# Patient Record
Sex: Male | Born: 1970 | Race: Black or African American | Hispanic: No | Marital: Single | Smoking: Current every day smoker
Health system: Southern US, Community
[De-identification: ages and names within clinical notes are randomized; demographics above are authoritative.]

## PROBLEM LIST (undated history)

## (undated) DIAGNOSIS — E669 Obesity, unspecified: Secondary | ICD-10-CM

## (undated) DIAGNOSIS — E119 Type 2 diabetes mellitus without complications: Secondary | ICD-10-CM

## (undated) DIAGNOSIS — N289 Disorder of kidney and ureter, unspecified: Secondary | ICD-10-CM

## (undated) DIAGNOSIS — I10 Essential (primary) hypertension: Secondary | ICD-10-CM

## (undated) DIAGNOSIS — C189 Malignant neoplasm of colon, unspecified: Secondary | ICD-10-CM

## (undated) DIAGNOSIS — C801 Malignant (primary) neoplasm, unspecified: Secondary | ICD-10-CM

---

## 2013-10-03 ENCOUNTER — Encounter (HOSPITAL_COMMUNITY): Payer: Self-pay | Admitting: Emergency Medicine

## 2013-10-03 ENCOUNTER — Emergency Department (HOSPITAL_COMMUNITY): Payer: No Typology Code available for payment source

## 2013-10-03 ENCOUNTER — Emergency Department (HOSPITAL_COMMUNITY)
Admission: EM | Admit: 2013-10-03 | Discharge: 2013-10-03 | Disposition: A | Discharge status: Home / Self Care | Payer: No Typology Code available for payment source | Attending: Emergency Medicine | Admitting: Emergency Medicine

## 2013-10-03 DIAGNOSIS — R079 Chest pain, unspecified: Secondary | ICD-10-CM | POA: Insufficient documentation

## 2013-10-03 DIAGNOSIS — I10 Essential (primary) hypertension: Secondary | ICD-10-CM | POA: Insufficient documentation

## 2013-10-03 DIAGNOSIS — Z862 Personal history of diseases of the blood and blood-forming organs and certain disorders involving the immune mechanism: Secondary | ICD-10-CM | POA: Insufficient documentation

## 2013-10-03 DIAGNOSIS — R05 Cough: Secondary | ICD-10-CM

## 2013-10-03 DIAGNOSIS — M549 Dorsalgia, unspecified: Secondary | ICD-10-CM | POA: Insufficient documentation

## 2013-10-03 DIAGNOSIS — F17200 Nicotine dependence, unspecified, uncomplicated: Secondary | ICD-10-CM | POA: Insufficient documentation

## 2013-10-03 DIAGNOSIS — E669 Obesity, unspecified: Secondary | ICD-10-CM | POA: Insufficient documentation

## 2013-10-03 DIAGNOSIS — R109 Unspecified abdominal pain: Secondary | ICD-10-CM | POA: Insufficient documentation

## 2013-10-03 DIAGNOSIS — E119 Type 2 diabetes mellitus without complications: Secondary | ICD-10-CM | POA: Insufficient documentation

## 2013-10-03 DIAGNOSIS — Z87448 Personal history of other diseases of urinary system: Secondary | ICD-10-CM | POA: Insufficient documentation

## 2013-10-03 DIAGNOSIS — R739 Hyperglycemia, unspecified: Secondary | ICD-10-CM

## 2013-10-03 DIAGNOSIS — R059 Cough, unspecified: Secondary | ICD-10-CM | POA: Insufficient documentation

## 2013-10-03 HISTORY — DX: Disorder of kidney and ureter, unspecified: N28.9

## 2013-10-03 HISTORY — DX: Malignant neoplasm of colon, unspecified: C18.9

## 2013-10-03 HISTORY — DX: Type 2 diabetes mellitus without complications: E11.9

## 2013-10-03 HISTORY — DX: Obesity, unspecified: E66.9

## 2013-10-03 HISTORY — DX: Essential (primary) hypertension: I10

## 2013-10-03 HISTORY — DX: Malignant (primary) neoplasm, unspecified: C80.1

## 2013-10-03 LAB — CBC
HEMATOCRIT: 42 % (ref 39.0–52.0)
Hemoglobin: 14.4 g/dL (ref 13.0–17.0)
MCH: 30 pg (ref 26.0–34.0)
MCHC: 34.3 g/dL (ref 30.0–36.0)
MCV: 87.5 fL (ref 78.0–100.0)
PLATELETS: 195 10*3/uL (ref 150–400)
RBC: 4.8 MIL/uL (ref 4.22–5.81)
RDW: 13.8 % (ref 11.5–15.5)
WBC: 10.2 10*3/uL (ref 4.0–10.5)

## 2013-10-03 LAB — URINALYSIS, ROUTINE W REFLEX MICROSCOPIC
Bilirubin Urine: NEGATIVE
Hgb urine dipstick: NEGATIVE
KETONES UR: NEGATIVE mg/dL
Leukocytes, UA: NEGATIVE
Nitrite: NEGATIVE
PROTEIN: 30 mg/dL — AB
Specific Gravity, Urine: 1.031 — ABNORMAL HIGH (ref 1.005–1.030)
UROBILINOGEN UA: 0.2 mg/dL (ref 0.0–1.0)
pH: 5 (ref 5.0–8.0)

## 2013-10-03 LAB — URINE MICROSCOPIC-ADD ON

## 2013-10-03 LAB — BASIC METABOLIC PANEL
BUN: 10 mg/dL (ref 6–23)
CHLORIDE: 100 meq/L (ref 96–112)
CO2: 22 mEq/L (ref 19–32)
Calcium: 8.9 mg/dL (ref 8.4–10.5)
Creatinine, Ser: 0.59 mg/dL (ref 0.50–1.35)
Glucose, Bld: 238 mg/dL — ABNORMAL HIGH (ref 70–99)
Potassium: 4.9 mEq/L (ref 3.7–5.3)
Sodium: 135 mEq/L — ABNORMAL LOW (ref 137–147)

## 2013-10-03 LAB — I-STAT TROPONIN, ED: TROPONIN I, POC: 0 ng/mL (ref 0.00–0.08)

## 2013-10-03 LAB — TROPONIN I: Troponin I: <0.3 ng/mL (ref <0.30)

## 2013-10-03 MED ORDER — TRAMADOL HCL 50 MG PO TABS: 50.0000 mg | ORAL_TABLET | Freq: Four times a day (QID) | ORAL | Status: DC | PRN

## 2013-10-03 MED ORDER — SODIUM CHLORIDE 0.9 % IV BOLUS (SEPSIS)
500.0000 mL | Freq: Once | INTRAVENOUS | Status: AC
  Administered 2013-10-03: 500 mL via INTRAVENOUS

## 2013-10-03 MED ORDER — HYDROCODONE-ACETAMINOPHEN 5-325 MG PO TABS: 2.0000 | ORAL_TABLET | ORAL | Status: AC | PRN

## 2013-10-03 MED ORDER — MORPHINE SULFATE 4 MG/ML IJ SOLN
6.0000 mg | Freq: Once | INTRAMUSCULAR | Status: AC
  Administered 2013-10-03: 6 mg via INTRAVENOUS
  Filled 2013-10-03: qty 2

## 2013-10-03 MED ORDER — IOHEXOL 350 MG/ML SOLN
80.0000 mL | Freq: Once | INTRAVENOUS | Status: AC | PRN
  Administered 2013-10-03: 80 mL via INTRAVENOUS

## 2013-10-03 MED ORDER — FAMOTIDINE 20 MG PO TABS
20.0000 mg | ORAL_TABLET | Freq: Once | ORAL | Status: AC
  Administered 2013-10-03: 20 mg via ORAL
  Filled 2013-10-03: qty 1

## 2013-10-03 NOTE — ED Provider Notes (Signed)
CSN: [id number]     Arrival date & time 10/03/13  1519 History   First MD Initiated Contact with Patient 10/03/13 1704     Chief Complaint  Patient presents with  . Chest Pain  . Back Pain  . Cough     (Consider location/radiation/quality/duration/timing/severity/associated sxs/prior Treatment) HPI Comments: 43 year old male with obesity high blood pressure, diabetes, hypercholesterolemia anemia presents with anterior chest burning for 2 days, cough and left flank pain for one week. Pain is worse with coughing at times breathing. Mild streak blood and occasional cough. No blood clot history, recent surgeries, unilateral leg pain and swelling, active cancer. No known cardiac disease. Patient has stress test 2 years ago and was okay per patient's recollection. Pain at times worse with palpation especially the left flank. No exertional symptoms. Lower chest pain is constant and at times worse with eating.  Patient is a 43 y.o. male presenting with chest pain, back pain, and cough. The history is provided by the patient.  Chest Pain Associated symptoms: back pain and cough   Associated symptoms: no abdominal pain, no fever, no headache, no shortness of breath and not vomiting   Back Pain Associated symptoms: chest pain   Associated symptoms: no abdominal pain, no dysuria, no fever and no headaches   Cough Associated symptoms: chest pain   Associated symptoms: no chills, no fever, no headaches, no rash and no shortness of breath     Past Medical History  Diagnosis Date  . Obesity   . Hypertension   . Diabetes mellitus without complication   . Renal disorder   . Renal disease    History reviewed. No pertinent past surgical history. History reviewed. No pertinent family history. History  Substance Use Topics  . Smoking status: Current Every Day Smoker    Types: Cigarettes  . Smokeless tobacco: Not on file  . Alcohol Use: No    Review of Systems  Constitutional: Negative for  fever and chills.  HENT: Negative for congestion.   Eyes: Negative for visual disturbance.  Respiratory: Positive for cough. Negative for shortness of breath.   Cardiovascular: Positive for chest pain. Negative for leg swelling.  Gastrointestinal: Negative for vomiting and abdominal pain.  Genitourinary: Positive for flank pain. Negative for dysuria.  Musculoskeletal: Positive for back pain. Negative for neck pain and neck stiffness.  Skin: Negative for rash.  Neurological: Negative for light-headedness and headaches.      Allergies  Review of patient's allergies indicates no known allergies.  Home Medications  No current outpatient prescriptions on file. BP 131/81  Pulse 90  Temp(Src) 98 F (36.7 C) (Oral)  Resp 16  SpO2 100% Physical Exam  Nursing note and vitals reviewed. Constitutional: He is oriented to person, place, and time. He appears well-developed and well-nourished.  HENT:  Head: Normocephalic and atraumatic.  Eyes: Conjunctivae are normal. Right eye exhibits no discharge. Left eye exhibits no discharge.  Neck: Normal range of motion. Neck supple. No tracheal deviation present.  Cardiovascular: Normal rate and regular rhythm.   Pulmonary/Chest: Effort normal and breath sounds normal.  Abdominal: Soft. He exhibits no distension. There is no tenderness. There is no guarding.  Musculoskeletal: He exhibits tenderness (left flank). He exhibits no edema.  Neurological: He is alert and oriented to person, place, and time.  Skin: Skin is warm. No rash noted.  Psychiatric: He has a normal mood and affect.    ED Course  Procedures (including critical care time) Labs Review Labs Reviewed  BASIC  METABOLIC PANEL - Abnormal; Notable for the following:    Sodium 135 (*)    Glucose, Bld 238 (*)    All other components within normal limits  URINALYSIS, ROUTINE W REFLEX MICROSCOPIC - Abnormal; Notable for the following:    Specific Gravity, Urine 1.031 (*)    Glucose, UA  >1000 (*)    Protein, ur 30 (*)    All other components within normal limits  CBC  TROPONIN I  URINE MICROSCOPIC-ADD ON  Randolm Idol, ED   Imaging Review Dg Chest 2 View  10/03/2013   CLINICAL DATA:  Left chest and back pain  EXAM: CHEST  2 VIEW  COMPARISON:  None.  FINDINGS: Overall low lung volumes, likely accounting for a prominent heart size. Lungs remain clear. No focal pneumonia, collapse or consolidation. No edema, effusion or pneumothorax. Trachea midline.  IMPRESSION: Low volume exam.  No acute process   Electronically Signed   By: Daryll Brod M.D.   On: 10/03/2013 15:59     EKG Interpretation   Date/Time:  Tuesday October 03 2013 15:24:53 EDT Ventricular Rate:  96 PR Interval:  154 QRS Duration: 82 QT Interval:  328 QTC Calculation: 414 R Axis:   23 Text Interpretation:  Normal sinus rhythm Moderate voltage criteria for  LVH, may be normal variant Borderline ECG Confirmed by Senai Ramnath  MD, Niccolo Burggraf  ([id number]) on 10/03/2013 5:46:32 PM      MDM   Final diagnoses:  Chest pain  Left flank pain  Cough   Patient's major complaints are constant atypical anterior nonradiating chest pain which have atypical features of cough, worse with movement and burning. Patient does have cardiac risk factors however I do not feel this is cardiac in nature. With left flank pain worse with movement and breathing and mild to moderate distress plan to CT angiography for pulmonary embolus. Will do delta troponins for cardiac rule out and have told patient he still needs close followup with primary doctor for possible repeat stress test. EKG no acute findings. Reviewed.    Patient improved with pain medicines and ED. CT and of the chest did not show any acute findings, no pulmonary embolus seen. Troponin negative an EKG no acute findings. Discussed close outpatient followup for repeat stress test and straight reasons to return.  Results and differential diagnosis were discussed with the  patient. Close follow up outpatient was discussed, patient comfortable with the plan.   Filed Vitals:   10/03/13 1845 10/03/13 1900 10/03/13 1927 10/03/13 1954  BP: 107/66 122/69 124/77 116/64  Pulse: 88 86  88  Temp:      TempSrc:      Resp: 12 19 13 18   SpO2: 97% 96% 99% 98%         Mariea Clonts, MD 10/03/13 2004

## 2013-10-03 NOTE — ED Notes (Signed)
Pt reports ongoing left side back pain. Started having chest pain last night and reports having productive cough x 2 days and coughing up blood. ekg done at triage, airway intact, no acute distress noted.

## 2013-10-03 NOTE — Discharge Instructions (Signed)
Discuss outpatient stress test with your doctor. If you were given medicines take as directed.  If you are on coumadin or contraceptives realize their levels and effectiveness is altered by many different medicines.  If you have any reaction (rash, tongues swelling, other) to the medicines stop taking and see a physician.   Please follow up as directed and return to the ER or see a physician for new or worsening symptoms.  Thank you. Filed Vitals:   10/03/13 1845 10/03/13 1900 10/03/13 1927 10/03/13 1954  BP: 107/66 122/69 124/77 116/64  Pulse: 88 86  88  Temp:      TempSrc:      Resp: 12 19 13 18   SpO2: 97% 96% 99% 98%

## 2013-11-08 ENCOUNTER — Ambulatory Visit: Payer: No Typology Code available for payment source | Admitting: Dietician

## 2015-09-01 IMAGING — CT CT ANGIO CHEST
2 of 8 series · 17 of 36 positions shown · IV contrast (CONTRAST)
Comparison: DG CHEST 2 VIEW dated 10/03/2013

CLINICAL DATA: Chest pain and hemoptysis for several weeks.

EXAM:
CT ANGIOGRAPHY CHEST WITH CONTRAST
TECHNIQUE: Multidetector CT imaging of the chest was performed using the
standard protocol during bolus administration of intravenous
contrast. Multiplanar CT image reconstructions and MIPs were
obtained to evaluate the vascular anatomy.
CONTRAST:  80mL OMNIPAQUE IOHEXOL 350 MG/ML SOLN

[Series 6: pe thins · axial · 0.80mm/px · z∈[-295,-30]mm · 16 of 301 slices shown]
[im 18/301  lung]
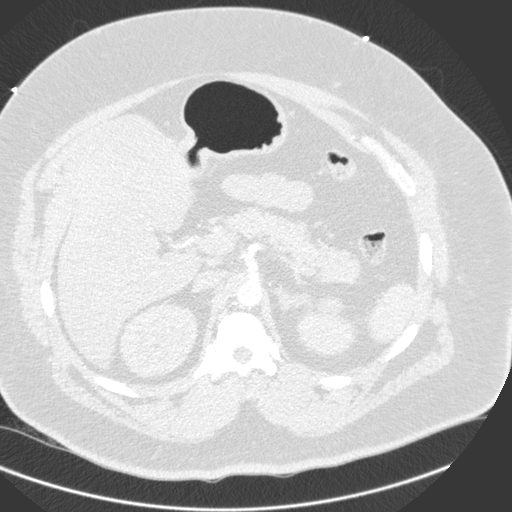
[im 36/301  mediastinal]
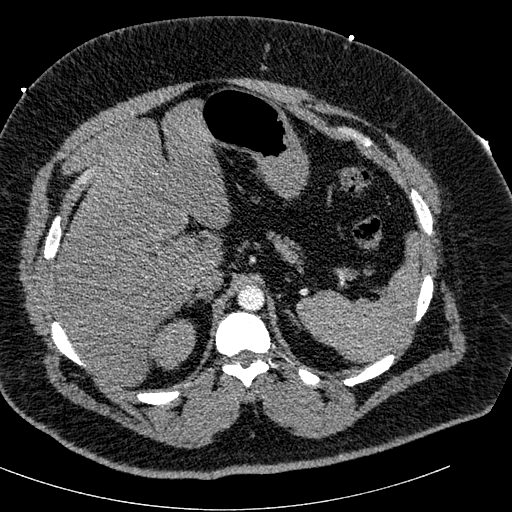
[im 53/301  lung]
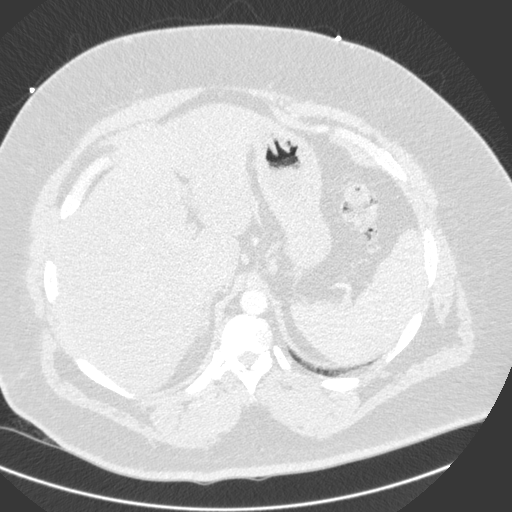
[im 71/301  mediastinal]
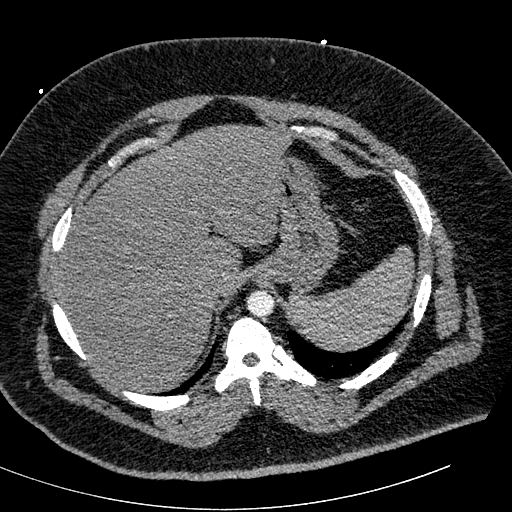
[im 89/301  lung]
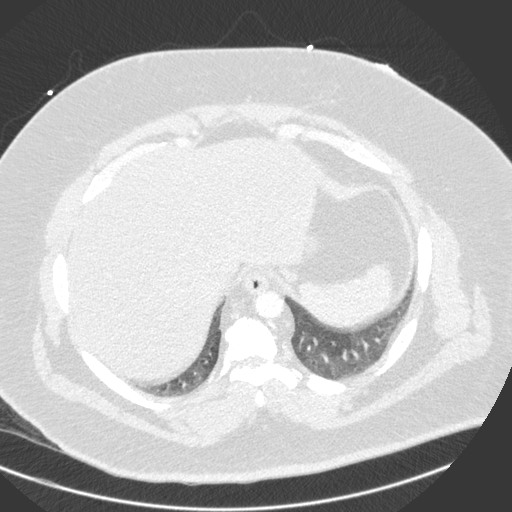
[im 106/301  mediastinal]
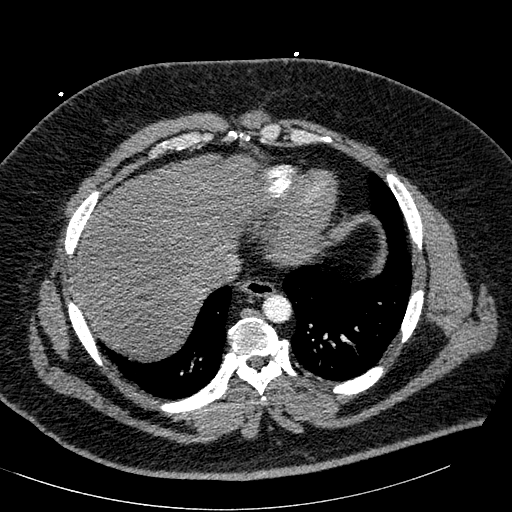
[im 124/301  lung]
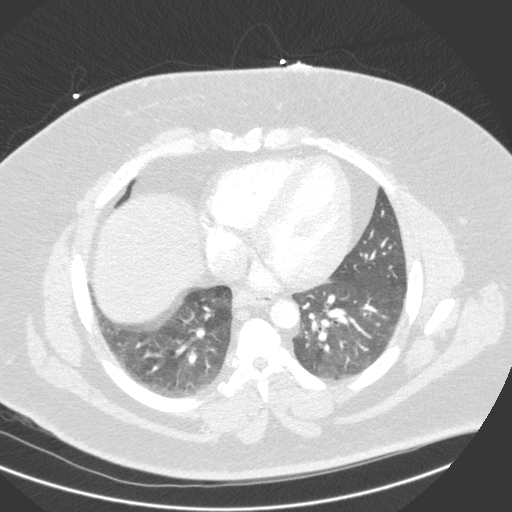
[im 142/301  mediastinal]
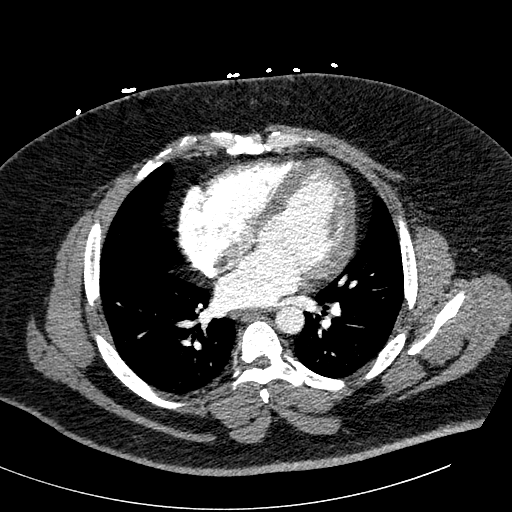
[im 159/301  lung]
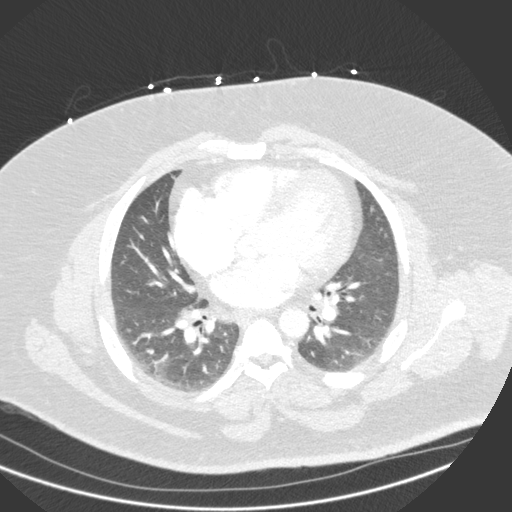
[im 177/301  mediastinal]
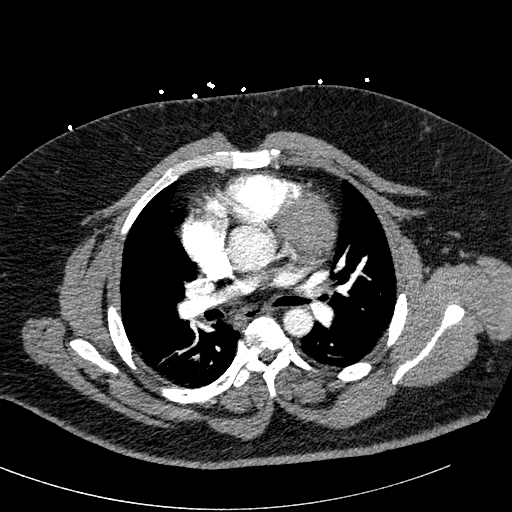
[im 195/301  lung]
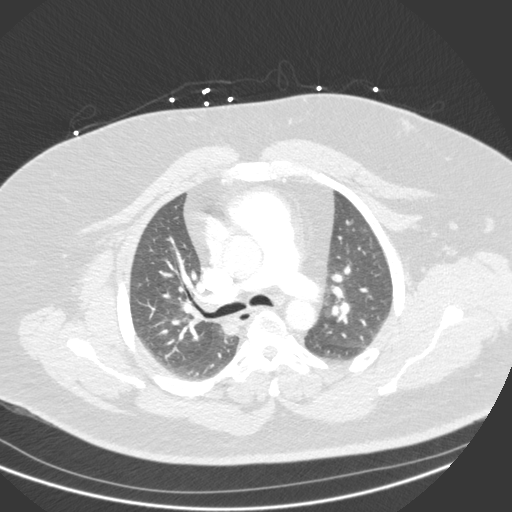
[im 212/301  mediastinal]
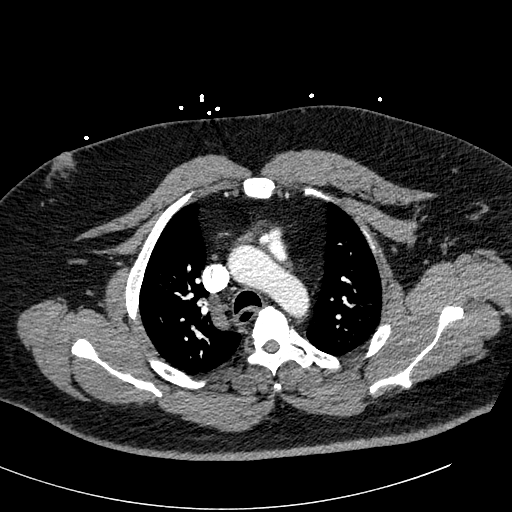
[im 230/301  lung]
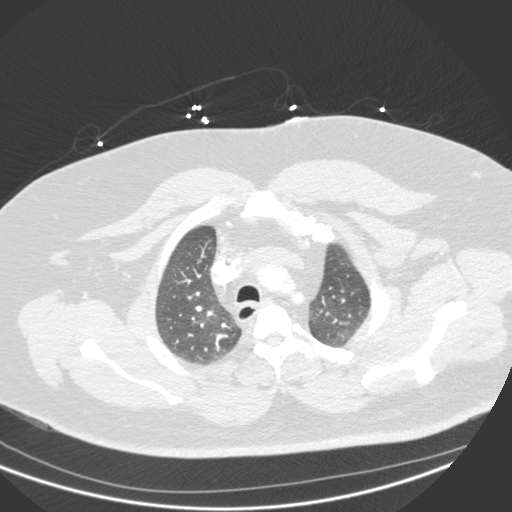
[im 248/301  mediastinal]
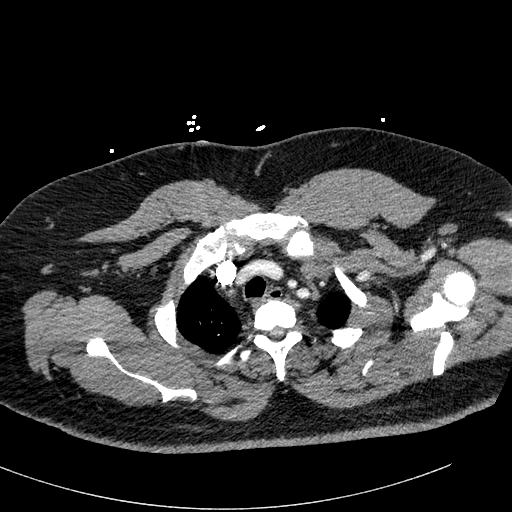
[im 265/301  lung]
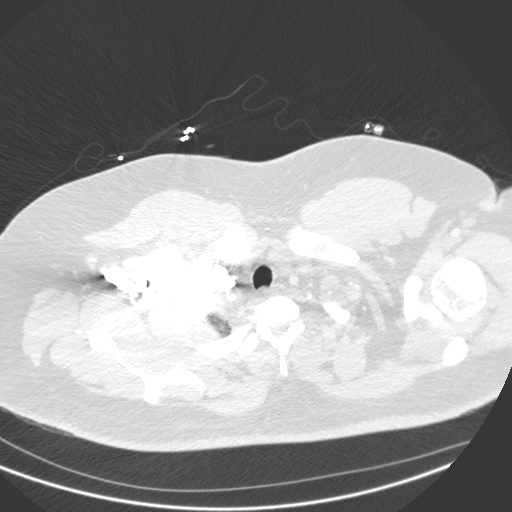
[im 283/301  mediastinal]
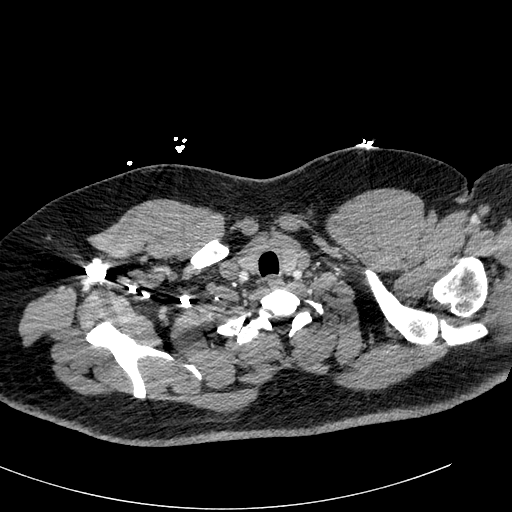

[Series 8068: cor · coronal · 0.80mm/px · 1 of 125 slices shown]
[im 63/125  mediastinal]
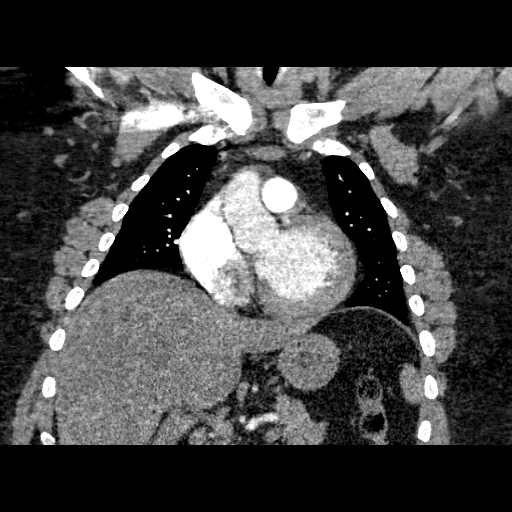

[17 of 36 positions shown; findings below may reference images not displayed]

FINDINGS: Mediastinal lipomatosis is present. The study is technically
adequate for evaluation of pulmonary embolus. No pulmonary embolism
is present. Thoracic esophagus appears normal. No axillary
adenopathy. No mediastinal or hilar adenopathy. Aorta and branch
vessels are within normal limits. Incidental imaging of the upper
abdomen is within normal limits. Possible fatty liver. Bones appear
normal.

The lungs show dependent atelectasis. No airspace disease or
findings to suggest pulmonary hemorrhage.

Review of the MIP images confirms the above findings.
IMPRESSION: Negative CTA chest.
